# Patient Record
Sex: Male | Born: 1980 | Hispanic: Yes | Smoking: Never smoker
Health system: Southern US, Community
[De-identification: ages and names within clinical notes are randomized; demographics above are authoritative.]

## PROBLEM LIST (undated history)

## (undated) DIAGNOSIS — J40 Bronchitis, not specified as acute or chronic: Secondary | ICD-10-CM

---

## 2020-01-02 ENCOUNTER — Emergency Department (HOSPITAL_COMMUNITY): Payer: Self-pay

## 2020-01-02 ENCOUNTER — Emergency Department (HOSPITAL_COMMUNITY)
Admission: EM | Admit: 2020-01-02 | Discharge: 2020-01-02 | Disposition: A | Discharge status: Home / Self Care | Payer: Self-pay | Attending: Emergency Medicine | Admitting: Emergency Medicine

## 2020-01-02 ENCOUNTER — Encounter (HOSPITAL_COMMUNITY): Payer: Self-pay

## 2020-01-02 ENCOUNTER — Other Ambulatory Visit: Payer: Self-pay

## 2020-01-02 DIAGNOSIS — R200 Anesthesia of skin: Secondary | ICD-10-CM | POA: Insufficient documentation

## 2020-01-02 DIAGNOSIS — Z5321 Procedure and treatment not carried out due to patient leaving prior to being seen by health care provider: Secondary | ICD-10-CM | POA: Insufficient documentation

## 2020-01-02 DIAGNOSIS — R0789 Other chest pain: Secondary | ICD-10-CM | POA: Insufficient documentation

## 2020-01-02 DIAGNOSIS — R0602 Shortness of breath: Secondary | ICD-10-CM | POA: Insufficient documentation

## 2020-01-02 HISTORY — DX: Bronchitis, not specified as acute or chronic: J40

## 2020-01-02 LAB — CBC
HCT: 44.7 % (ref 39.0–52.0)
Hemoglobin: 15.3 g/dL (ref 13.0–17.0)
MCH: 30.7 pg (ref 26.0–34.0)
MCHC: 34.2 g/dL (ref 30.0–36.0)
MCV: 89.8 fL (ref 80.0–100.0)
Platelets: 352 10*3/uL (ref 150–400)
RBC: 4.98 MIL/uL (ref 4.22–5.81)
RDW: 12.5 % (ref 11.5–15.5)
WBC: 10.9 10*3/uL — ABNORMAL HIGH (ref 4.0–10.5)
nRBC: 0 % (ref 0.0–0.2)

## 2020-01-02 LAB — TROPONIN I (HIGH SENSITIVITY): Troponin I (High Sensitivity): 3 ng/L (ref <18)

## 2020-01-02 LAB — BASIC METABOLIC PANEL
Anion gap: 8 (ref 5–15)
BUN: 13 mg/dL (ref 6–20)
CO2: 25 mmol/L (ref 22–32)
Calcium: 9.3 mg/dL (ref 8.9–10.3)
Chloride: 102 mmol/L (ref 98–111)
Creatinine, Ser: 0.92 mg/dL (ref 0.61–1.24)
GFR calc Af Amer: >60 mL/min (ref >60)
GFR calc non Af Amer: >60 mL/min (ref >60)
Glucose, Bld: 107 mg/dL — ABNORMAL HIGH (ref 70–99)
Potassium: 3.7 mmol/L (ref 3.5–5.1)
Sodium: 135 mmol/L (ref 135–145)

## 2020-01-02 NOTE — ED Notes (Signed)
Pt had to leave to go to work 

## 2020-01-02 NOTE — ED Triage Notes (Addendum)
Interpreter [phone] [name]  Pt reports feeling "desperate" and then had Left side chest pain and nauseous. Pt denies actual CP states, "I feel desperate, SOB and then my hands and feet went numb." similar episode 15 days ago

## 2020-10-19 ENCOUNTER — Emergency Department (HOSPITAL_COMMUNITY)
Admission: EM | Admit: 2020-10-19 | Discharge: 2020-10-19 | Disposition: A | Discharge status: Home / Self Care | Payer: Self-pay | Attending: Emergency Medicine | Admitting: Emergency Medicine

## 2020-10-19 ENCOUNTER — Encounter (HOSPITAL_COMMUNITY): Payer: Self-pay | Admitting: Emergency Medicine

## 2020-10-19 ENCOUNTER — Emergency Department (HOSPITAL_COMMUNITY): Payer: Self-pay

## 2020-10-19 ENCOUNTER — Other Ambulatory Visit: Payer: Self-pay

## 2020-10-19 DIAGNOSIS — K279 Peptic ulcer, site unspecified, unspecified as acute or chronic, without hemorrhage or perforation: Secondary | ICD-10-CM | POA: Insufficient documentation

## 2020-10-19 LAB — CBC WITH DIFFERENTIAL/PLATELET
Abs Immature Granulocytes: 0.05 10*3/uL (ref 0.00–0.07)
Basophils Absolute: 0.1 10*3/uL (ref 0.0–0.1)
Basophils Relative: 1 %
Eosinophils Absolute: 0.5 10*3/uL (ref 0.0–0.5)
Eosinophils Relative: 5 %
HCT: 42.2 % (ref 39.0–52.0)
Hemoglobin: 14.7 g/dL (ref 13.0–17.0)
Immature Granulocytes: 1 %
Lymphocytes Relative: 40 %
Lymphs Abs: 4.1 10*3/uL — ABNORMAL HIGH (ref 0.7–4.0)
MCH: 30.5 pg (ref 26.0–34.0)
MCHC: 34.8 g/dL (ref 30.0–36.0)
MCV: 87.6 fL (ref 80.0–100.0)
Monocytes Absolute: 0.8 10*3/uL (ref 0.1–1.0)
Monocytes Relative: 8 %
Neutro Abs: 4.8 10*3/uL (ref 1.7–7.7)
Neutrophils Relative %: 45 %
Platelets: 311 10*3/uL (ref 150–400)
RBC: 4.82 MIL/uL (ref 4.22–5.81)
RDW: 12.9 % (ref 11.5–15.5)
WBC: 10.3 10*3/uL (ref 4.0–10.5)
nRBC: 0 % (ref 0.0–0.2)

## 2020-10-19 LAB — COMPREHENSIVE METABOLIC PANEL
ALT: 29 U/L (ref 0–44)
AST: 21 U/L (ref 15–41)
Albumin: 4.2 g/dL (ref 3.5–5.0)
Alkaline Phosphatase: 60 U/L (ref 38–126)
Anion gap: 9 (ref 5–15)
BUN: 13 mg/dL (ref 6–20)
CO2: 23 mmol/L (ref 22–32)
Calcium: 9 mg/dL (ref 8.9–10.3)
Chloride: 104 mmol/L (ref 98–111)
Creatinine, Ser: 0.97 mg/dL (ref 0.61–1.24)
GFR, Estimated: >60 mL/min (ref >60)
Glucose, Bld: 121 mg/dL — ABNORMAL HIGH (ref 70–99)
Potassium: 3.7 mmol/L (ref 3.5–5.1)
Sodium: 136 mmol/L (ref 135–145)
Total Bilirubin: 1.3 mg/dL — ABNORMAL HIGH (ref 0.3–1.2)
Total Protein: 7.4 g/dL (ref 6.5–8.1)

## 2020-10-19 LAB — URINALYSIS, ROUTINE W REFLEX MICROSCOPIC
Bilirubin Urine: NEGATIVE
Glucose, UA: NEGATIVE mg/dL
Hgb urine dipstick: NEGATIVE
Ketones, ur: NEGATIVE mg/dL
Leukocytes,Ua: NEGATIVE
Nitrite: NEGATIVE
Protein, ur: NEGATIVE mg/dL
Specific Gravity, Urine: 1.019 (ref 1.005–1.030)
pH: 5 (ref 5.0–8.0)

## 2020-10-19 LAB — LIPASE, BLOOD: Lipase: 28 U/L (ref 11–51)

## 2020-10-19 MED ORDER — SUCRALFATE 1 G PO TABS: 1.0000 g | ORAL_TABLET | Freq: Three times a day (TID) | ORAL | 0 refills | Status: AC

## 2020-10-19 MED ORDER — OMEPRAZOLE MAGNESIUM 20 MG PO TBEC: 40.0000 mg | DELAYED_RELEASE_TABLET | Freq: Every day | ORAL | 1 refills | Status: AC

## 2020-10-19 NOTE — ED Provider Notes (Signed)
Emergency Medicine Provider Triage Evaluation Note  Jonathan Barnett , a 40 y.o. male  was evaluated in triage.  Pt complains of RUQ abd pain onset last night. Pt reports he has had this pain intermittently in the last 6 mos, but it has been intense over the last 3 days. Tonight the pain is significantly worse.  Associated with NBNB emesis and some loose stools.  Reports pain is always worse after eating.  Review of Systems  Positive: Abd pain, nausea, vomiting Negative: Fever, chills, weakness, back pain  Physical Exam  BP 133/85 (BP Location: Left Arm)   Pulse (!) 57   Temp 98.9 F (37.2 C) (Oral)   Resp 17   SpO2 100%  Gen:   Awake, uncomfortable Resp:  Normal effort  MSK:   Moves extremities without difficulty  Other:  TTP and guarding in the RUQ; no CVA tenderness bilaterally.  Medical Decision Making  Medically screening exam initiated at 6:22 AM.  Appropriate orders placed.  Jonathan Barnett was informed that the remainder of the evaluation will be completed by another provider, this initial triage assessment does not replace that evaluation, and the importance of remaining in the ED until their evaluation is complete.  RUQ abd pain - suspect cholelithiasis vs cholecystitis.    Jonathan Barnett, Boyd Kerbs 10/19/20 [id number]    Gilda Crease, MD 10/19/20 [phone]

## 2020-10-19 NOTE — ED Triage Notes (Signed)
Patient with abdominal pain, right under right rib cage that comes and goes for the last 3 days.  He states that it has been happening for the last 6 months but increased in the last three days.  Patient has vomited, does have pain in his back but denies any urinary complaints.  Patient does have some diarrhea.

## 2020-10-19 NOTE — ED Provider Notes (Signed)
[facility] EMERGENCY DEPARTMENT Provider Note   CSN: [id number] Arrival date & time: 10/19/20  0865     History Chief Complaint  Patient presents with   Abdominal Pain    Jonathan Barnett is a 40 y.o. male.  HPI     40 year old comes in a chief complaint of abdominal pain.  He has history of heavy alcohol consumption.  No medical problems.  Patient reports that over the last several weeks he has had intermittent episodes of abdominal pain.  Pain is epigastric and right upper quadrant.  Typically the pain is after certain food ingestion.  Over the last 2 days his pain has been more constant and severe.  He stopped alcohol consumption couple of weeks ago because of this pain.  He had associated nausea with vomiting, nonbilious, nonbloody.  Emesis x2.  He denies any diarrhea.  Past Medical History:  Diagnosis Date   Bronchitis     There are no problems to display for this patient.   History reviewed. No pertinent surgical history.     No family history on file.  Social History   Tobacco Use   Smoking status: Never   Smokeless tobacco: Never  Substance Use Topics   Alcohol use: Yes    Comment: social    Drug use: Yes    Types: Marijuana    Home Medications Prior to Admission medications   Medication Sig Start Date End Date Taking? Authorizing Provider  omeprazole (PRILOSEC OTC) 20 MG tablet Take 2 tablets (40 mg total) by mouth daily. 10/19/20  Yes Derwood Kaplan, MD  sucralfate (CARAFATE) 1 g tablet Take 1 tablet (1 g total) by mouth 4 (four) times daily -  with meals and at bedtime. 10/19/20  Yes Derwood Kaplan, MD    Allergies    Patient has no known allergies.  Review of Systems   Review of Systems  Constitutional:  Positive for activity change.  Gastrointestinal:  Positive for abdominal pain, nausea and vomiting.  All other systems reviewed and are negative.  Physical Exam Updated Vital Signs BP 128/76 (BP Location: Left Arm)   Pulse  (!) 50   Temp 97.6 F (36.4 C) (Oral)   Resp 18   SpO2 100%   Physical Exam Vitals and nursing note reviewed.  Constitutional:      Appearance: He is well-developed.  HENT:     Head: Atraumatic.  Cardiovascular:     Rate and Rhythm: Normal rate.  Pulmonary:     Effort: Pulmonary effort is normal.  Abdominal:     Tenderness: There is abdominal tenderness in the epigastric area. There is guarding.  Musculoskeletal:     Cervical back: Neck supple.  Skin:    General: Skin is warm.  Neurological:     Mental Status: He is alert and oriented to person, place, and time.    ED Results / Procedures / Treatments   Labs (all labs ordered are listed, but only abnormal results are displayed) Labs Reviewed  CBC WITH DIFFERENTIAL/PLATELET - Abnormal; Notable for the following components:      Result Value   Lymphs Abs 4.1 (*)    All other components within normal limits  COMPREHENSIVE METABOLIC PANEL - Abnormal; Notable for the following components:   Glucose, Bld 121 (*)    Total Bilirubin 1.3 (*)    All other components within normal limits  LIPASE, BLOOD  URINALYSIS, ROUTINE W REFLEX MICROSCOPIC    EKG None  Radiology US Abdomen Limited  Result Date: 10/19/2020 CLINICAL DATA:  40 year old male with right upper quadrant pain symptoms for 6 months, progressed in the past 3 days. EXAM: ULTRASOUND ABDOMEN LIMITED RIGHT UPPER QUADRANT COMPARISON:  None. FINDINGS: Gallbladder: No gallstones or wall thickening visualized. No sonographic Murphy sign noted by sonographer. Probable fluid within bowel rather than free fluid near the gallbladder on image 40. No other convincing pericholecystic fluid. Common bile duct: Diameter: 3 mm, normal. Liver: No focal lesion identified. Within normal limits in parenchymal echogenicity. Portal vein is patent on color Doppler imaging with normal direction of blood flow towards the liver. Other: Negative visible right kidney. IMPRESSION: No cholelithiasis or  gallbladder wall thickening, no convincing abnormality on right upper quadrant ultrasound. Electronically Signed   By: Odessa Fleming M.D.   On: 10/19/2020 07:48    Procedures Procedures   Medications Ordered in ED Medications - No data to display  ED Course  I have reviewed the triage vital signs and the nursing notes.  Pertinent labs & imaging results that were available during my care of the patient were reviewed by me and considered in my medical decision making (see chart for details).    MDM Rules/Calculators/A&P                           DDx includes: Pancreatitis Hepatobiliary pathology including cholecystitis Gastritis/PUD SBO ACS syndrome  40 year old male comes in a chief complaint of abdominal pain.  On exam he is noted to have right upper quadrant and epigastric tenderness, with worst peak pain being over the epigastrium.  Ultrasound right upper quadrant and basic labs are reassuring.  Negative Murphy sign.  No gallstones on ultrasound.  Suspect very symptoms could be because of PUD.  We will start him on omeprazole and advised return precautions to the ER if he starts having bleeding or worsening abdominal pain.  Translation service was utilized for this visit.   Final Clinical Impression(s) / ED Diagnoses Final diagnoses:  PUD (peptic ulcer disease)    Rx / DC Orders ED Discharge Orders          Ordered    omeprazole (PRILOSEC OTC) 20 MG tablet  Daily        10/19/20 1233    sucralfate (CARAFATE) 1 g tablet  3 times daily with meals & bedtime        10/19/20 1233             Derwood Kaplan, MD 10/19/20 1358

## 2022-05-15 IMAGING — US US ABDOMEN LIMITED
1 series · 14 of 25 positions shown · non-contrast
Comparison: None.

CLINICAL DATA: 40-year-old male with right upper quadrant pain
symptoms for 6 months, progressed in the past 3 days.

EXAM:
ULTRASOUND ABDOMEN LIMITED RIGHT UPPER QUADRANT

[Series 1: us abdomen limited · 14 of 47 slices shown]
[im 1/47]
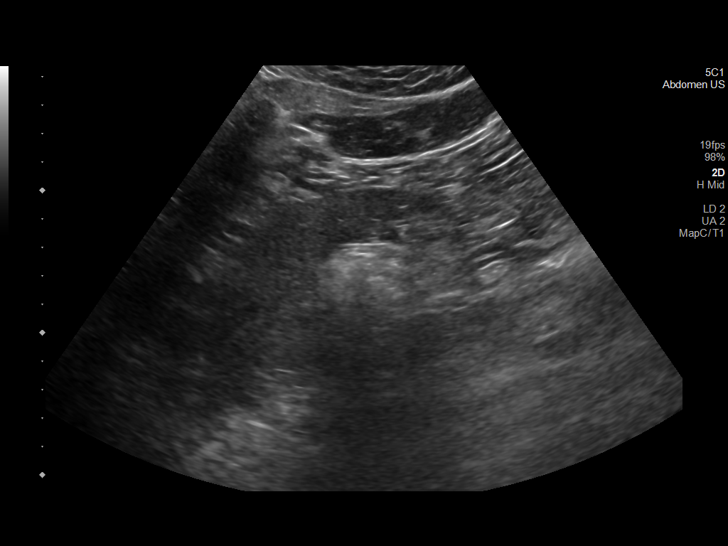
[im 4/47]
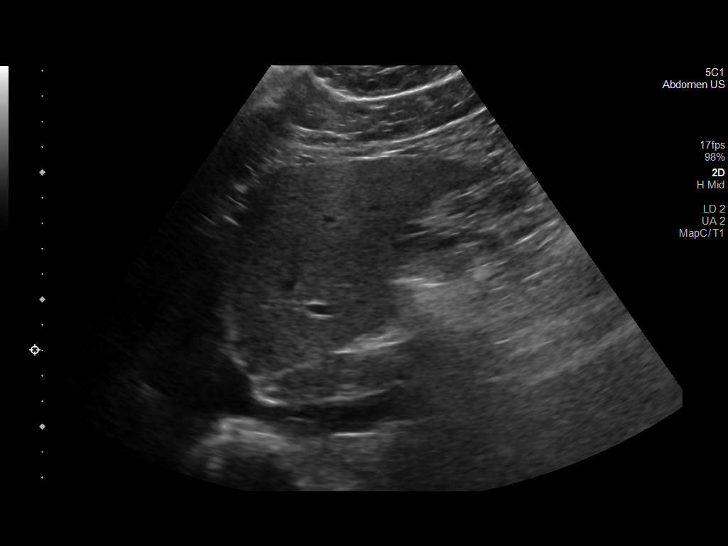
[im 8/47]
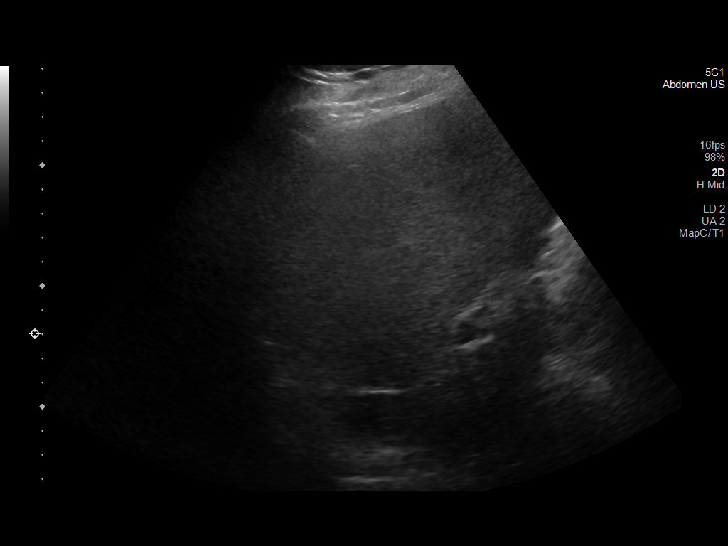
[im 12/47]
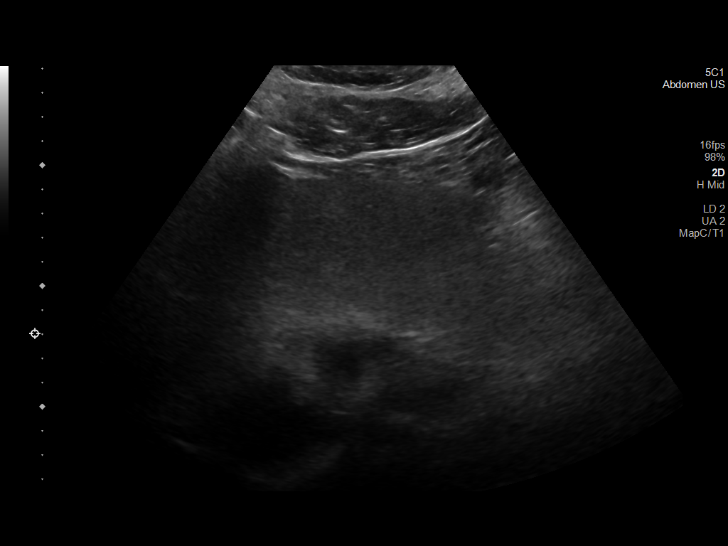
[im 16/47]
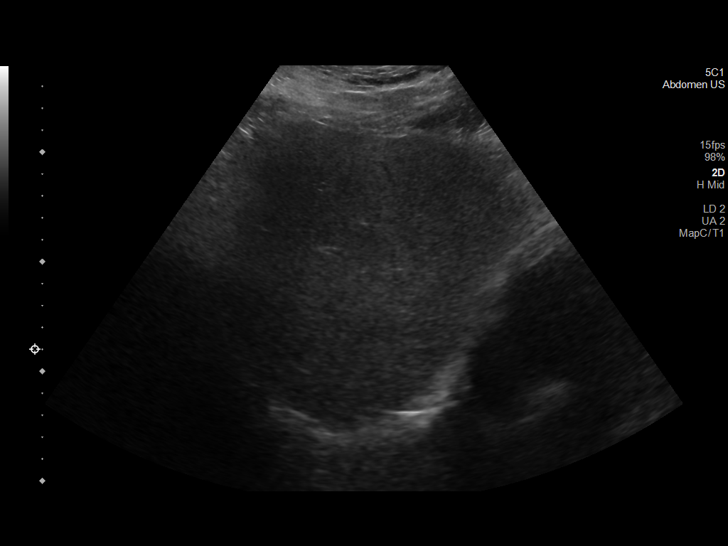
[im 18/47]
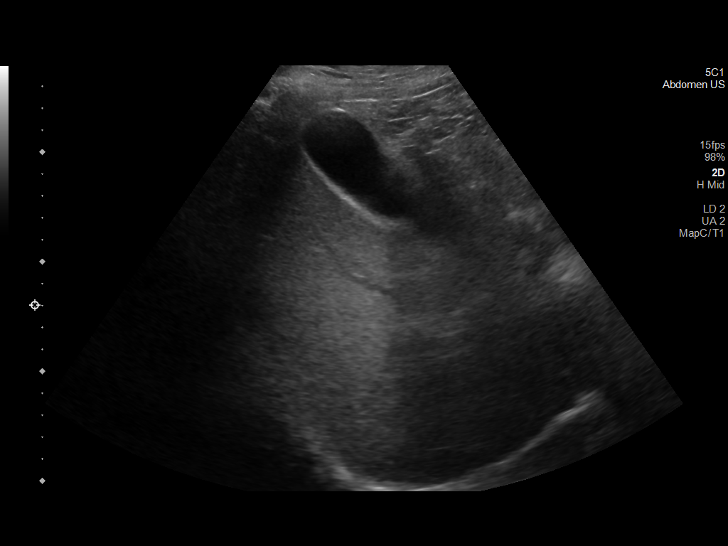
[im 22/47]
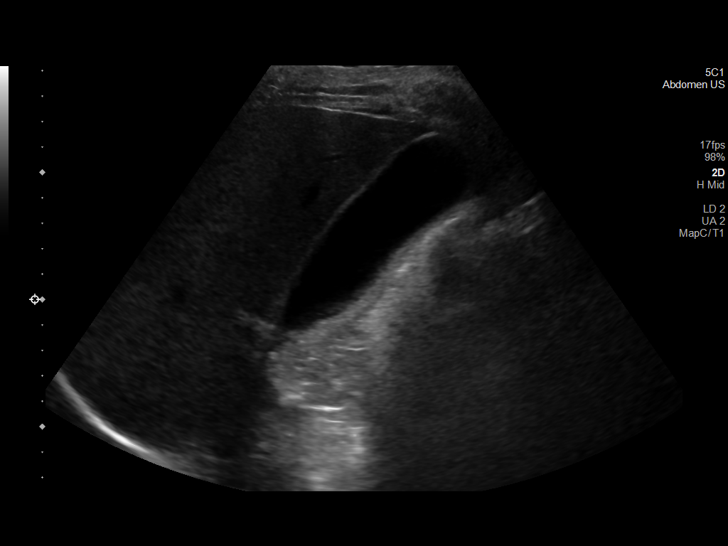
[im 25/47]
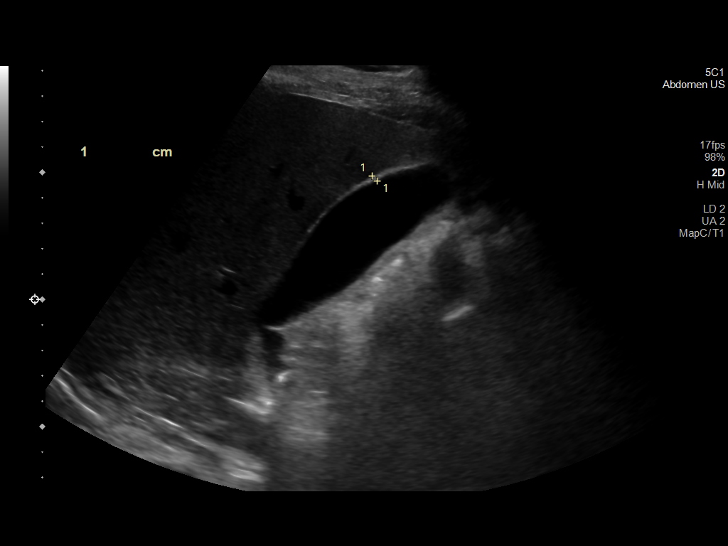
[im 29/47]
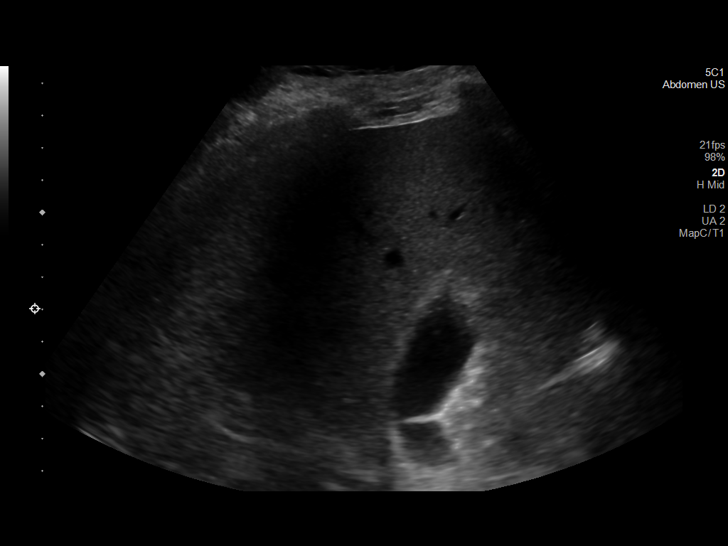
[im 31/47]
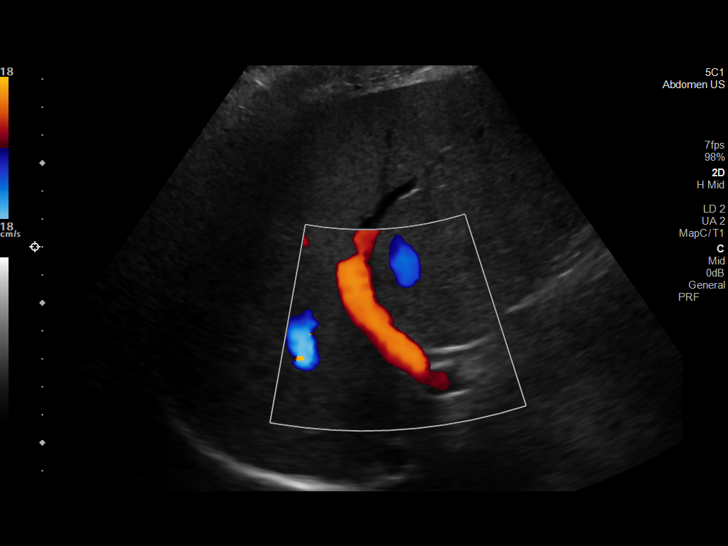
[im 35/47]
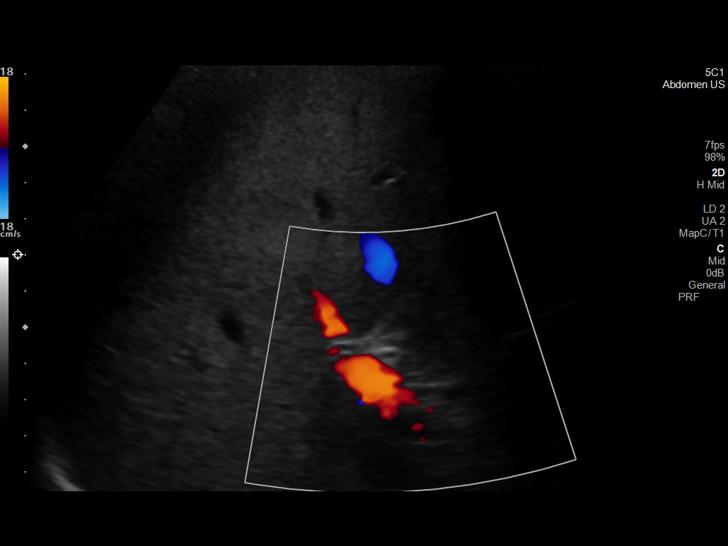
[im 39/47]
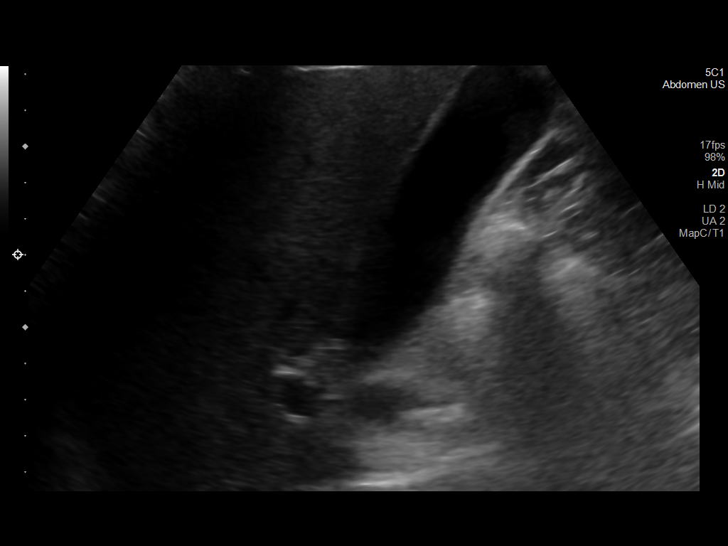
[im 43/47]
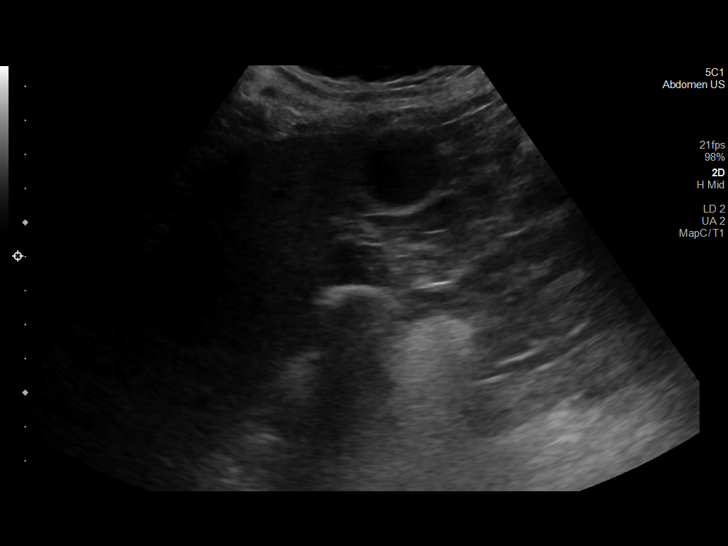
[im 47/47]
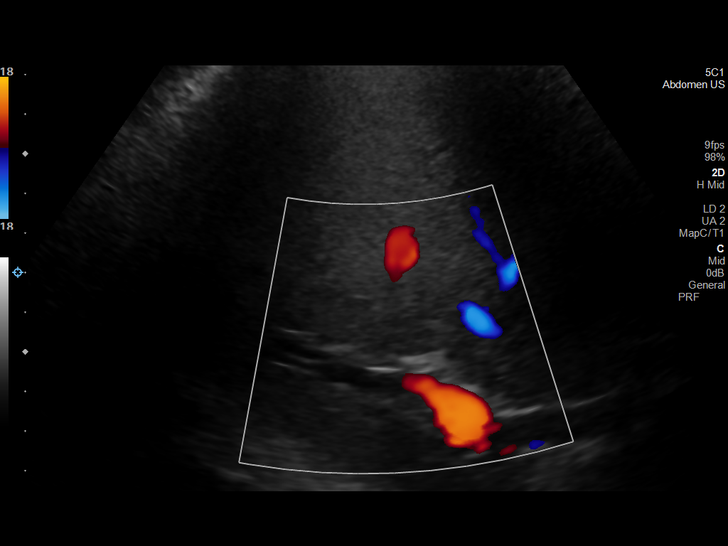

[14 of 25 positions shown; findings below may reference images not displayed]

FINDINGS: Gallbladder:

No gallstones or wall thickening visualized. No sonographic Murphy
sign noted by sonographer. Probable fluid within bowel rather than
free fluid near the gallbladder on image 40. No other convincing
pericholecystic fluid.

Common bile duct:

Diameter: 3 mm, normal.

Liver:

No focal lesion identified. Within normal limits in parenchymal
echogenicity. Portal vein is patent on color Doppler imaging with
normal direction of blood flow towards the liver.

Other: Negative visible right kidney.
IMPRESSION: No cholelithiasis or gallbladder wall thickening, no convincing
abnormality on right upper quadrant ultrasound.
